# Patient Record
Sex: Male | Born: 1980 | Race: White | Hispanic: No | Marital: Single | Smoking: Current every day smoker
Health system: Southern US, Community
[De-identification: ages and names within clinical notes are randomized; demographics above are authoritative.]

---

## 2013-11-14 ENCOUNTER — Emergency Department (HOSPITAL_BASED_OUTPATIENT_CLINIC_OR_DEPARTMENT_OTHER)
Admission: EM | Admit: 2013-11-14 | Discharge: 2013-11-14 | Disposition: A | Discharge status: Home / Self Care | Payer: Self-pay | Attending: Emergency Medicine | Admitting: Emergency Medicine

## 2013-11-14 ENCOUNTER — Encounter (HOSPITAL_BASED_OUTPATIENT_CLINIC_OR_DEPARTMENT_OTHER): Payer: Self-pay | Admitting: Emergency Medicine

## 2013-11-14 DIAGNOSIS — H04301 Unspecified dacryocystitis of right lacrimal passage: Secondary | ICD-10-CM

## 2013-11-14 DIAGNOSIS — H579 Unspecified disorder of eye and adnexa: Secondary | ICD-10-CM | POA: Insufficient documentation

## 2013-11-14 DIAGNOSIS — F17200 Nicotine dependence, unspecified, uncomplicated: Secondary | ICD-10-CM | POA: Insufficient documentation

## 2013-11-14 DIAGNOSIS — H04309 Unspecified dacryocystitis of unspecified lacrimal passage: Secondary | ICD-10-CM | POA: Insufficient documentation

## 2013-11-14 MED ORDER — AMOXICILLIN-POT CLAVULANATE 500-125 MG PO TABS: 1.0000 | ORAL_TABLET | Freq: Three times a day (TID) | ORAL | Status: AC

## 2013-11-14 MED ORDER — IBUPROFEN 800 MG PO TABS
800.0000 mg | ORAL_TABLET | Freq: Once | ORAL | Status: AC
  Administered 2013-11-14: 800 mg via ORAL
  Filled 2013-11-14: qty 1

## 2013-11-14 NOTE — ED Provider Notes (Signed)
CSN: [id number]     Arrival date & time 11/14/13  1051 History   First MD Initiated Contact with Patient 11/14/13 1109     Chief Complaint  Patient presents with  . Eye Problem   HPI Seth Hudson is a 33 yo man with no PMH who is here over concern for his swollen right eye. He had a stye on the upper lid of his right eye last week, treated with OTC ointment. It subsided, then recurred yesterday. He used the OTC ointment again, then awoke with "goop" in his right eye and pain, worst in his upper right lid medially. His vision is mostly obscured. His pain is 6/10 in intensity and feels like a knife cutting into his upper lid. He has never had eye problems or styes prior to last week.  History reviewed. No pertinent past medical history. No past surgical history on file. No family history on file. History  Substance Use Topics  . Smoking status: Current Every Day Smoker -- 0.50 packs/day    Types: Cigarettes  . Smokeless tobacco: Not on file  . Alcohol Use: No    Review of Systems General: no recent illness Skin: birthmark (no hair growth) on head HEENT: no headaches Cardiac: no chest pain Respiratory: no SOB GI: no changes Urinary: no changes Endocrine: no changes Psychiatric: no changes  Allergies  Phenobarbital  Home Medications   Prior to Admission medications   Not on File   BP 124/92  Pulse 100  Temp(Src) 98.4 F (36.9 C) (Oral)  Resp 16  Ht 6\' 2"  (1.88 m)  Wt 415 lb (188.243 kg)  BMI 53.26 kg/m2  SpO2 95% Physical Exam Appearance: sitting comfortably in bed with right eye grossly edematous; upper and lower lids touching due to the swelling HENT: AT/[place] Ophthalmologic: EOMi, PERRL, exudate at inner palpebral fissure OD, tender to palpation medially, nontender on temporal side, noneyrthematous  Heart: RRR, normal S1S2 Lungs: CTAB, no wheezes Abdomen: BS+, no tenderness Musculoskeletal: no tenderness Extremities: no edema Neurologic: A&Ox3, facial sensation  intact Skin: patch of alopecia right parietal area    ED Course  Procedures (including critical care time) Labs Review Labs Reviewed - No data to display  Imaging Review No results found.   EKG Interpretation None      MDM   Final diagnoses:  None    Patient here with dacrocystitis in the right eye. He will be treated with warm compresses and sent out on a 10 day course of augmentin 500mg  TID. He can continue to manage symptoms with warm compress. [facility], but prefers to seek an ophthalmologist in [place], closer to home.   Dionne AnoJulia Jamirah Zelaya, MD 11/14/13 1145

## 2013-11-14 NOTE — ED Provider Notes (Signed)
I saw and evaluated the patient, reviewed the resident's note and I agree with the findings and plan.   .Face to face Exam:  General:  Awake HEENT:  Atraumatic  Tenderness and swelling R upper lid as noted Resp:  Normal effort Abd:  Nondistended Neuro:No focal weakness   Nelia Shiobert L Kelseigh Diver, MD 11/14/13 1206

## 2013-11-14 NOTE — ED Notes (Signed)
Pt. Presents with swollen right eyelid. Pt. States that he noticed a bump on his eye yesterday that he thought was a stye. Pt. Purchased some stye ointment OTC from [facility] and used it once yesterday morning and once yesterday PM before bed. Pt. Reports that he woke up this AM with increased swelling and tenderness to the right eye.

## 2013-11-14 NOTE — Discharge Instructions (Signed)
Please take the augmentin (antibiotics) three times a day for the next 10 days. Follow up with an ophthalmologist in the next week. You may use warm compresses to help with the symptoms as well.  Dacryocystitis  Dacryocystitis is an infection of the tear sac (lacrimal sac). The lacrimal sac lies between the inner corner of the eyelids and the nose. The glands of the eyelids produce tears. This is to keep the surface of the eye wet and protect it. These tears drain from the surface of the eyes through a duct in each lid (lacrimal ducts), then through the lacrimal sac into the nose. The tears are then swallowed. If the lacrimal sacs become blocked, bacteria begin to buildup. The lacrimal sacs can become infected. Dacryocystitis may be sudden (acute) or long-lasting (chronic). This problem is most common in infants because the tear ducts are not fully developed and clog easily. In that case, infants may have episodes of tearing and infection. However, in most cases, the problem gets better as the infant grows. CAUSES  The cause is often unknown. Known causes can include:  Malformation of the lacrimal sac.  Injury to the eye.  Eye infection.  Injury or inflammation of the nasal passages. SYMPTOMS   Usually only one eye is involved.  Excessive tearing and watering from the involved eye.  Tenderness, redness, and swelling of the lower lid near the nose.  A sore, red, inflamed bump on the inner corner of the lower lid. DIAGNOSIS  A diagnosis is made after an eye exam to see how much blockage is present and if the surface of the eye is also infected. A culture of the fluid from the lacrimal sac may be examined to find if a specific infection is present. TREATMENT  Treatment depends on:   The person's age.  Whether or not the infection is chronic or acute.  The amount of blockage that is present. Additional treatment Sometimes massaging the area (starting from the inside of the eye and  gently massaging down toward the nose) will improve the condition, combined with antibiotic eyedrops or ointments. If massaging the area does not work, it may be necessary to probe the ducts and open up the drainage system. While this is easily done in the office in adults, probing usually has to be done under general anesthesia in infants.  If the blockage cannot be cleared by probing, surgery may be needed under general anesthesia to create a direct opening for tears to flow between the lacrimal sac and the inside of the nose (dacryocystorhinostomy, DCR). HOME CARE INSTRUCTIONS   Use any antibiotic eyedrops, ointment, or pills as directed by the caregiver. Finish all medicines even if the symptoms start to get better.  Massage the lacrimal sac as directed by the caregiver. SEEK IMMEDIATE MEDICAL CARE IF:   There is increased pain, swelling, redness, or drainage from the eye.  Muscle aches, chills, or a general sick feeling develop.  A fever or persistent symptoms develop for more than 2-3 days.  The fever and symptoms suddenly get worse. MAKE SURE YOU:   Understand these instructions.  Will watch your condition.  Will get help right away if you are not doing well or get worse. Document Released: 03/12/2000 Document Revised: 07/30/2013 Document Reviewed: 08/16/2011 [facility] Patient Information 2015 [place] [place]. This information is not intended to replace advice given to you by your health care provider. Make sure you discuss any questions you have with your health care provider.

## 2013-11-15 ENCOUNTER — Emergency Department (HOSPITAL_BASED_OUTPATIENT_CLINIC_OR_DEPARTMENT_OTHER): Payer: Self-pay

## 2013-11-15 ENCOUNTER — Emergency Department (HOSPITAL_BASED_OUTPATIENT_CLINIC_OR_DEPARTMENT_OTHER)
Admission: EM | Admit: 2013-11-15 | Discharge: 2013-11-15 | Disposition: A | Discharge status: Home / Self Care | Payer: Self-pay | Attending: Emergency Medicine | Admitting: Emergency Medicine

## 2013-11-15 ENCOUNTER — Encounter (HOSPITAL_BASED_OUTPATIENT_CLINIC_OR_DEPARTMENT_OTHER): Payer: Self-pay | Admitting: Emergency Medicine

## 2013-11-15 DIAGNOSIS — F17200 Nicotine dependence, unspecified, uncomplicated: Secondary | ICD-10-CM | POA: Insufficient documentation

## 2013-11-15 DIAGNOSIS — Z792 Long term (current) use of antibiotics: Secondary | ICD-10-CM | POA: Insufficient documentation

## 2013-11-15 DIAGNOSIS — H5710 Ocular pain, unspecified eye: Secondary | ICD-10-CM | POA: Insufficient documentation

## 2013-11-15 DIAGNOSIS — H019 Unspecified inflammation of eyelid: Secondary | ICD-10-CM | POA: Insufficient documentation

## 2013-11-15 LAB — CBC WITH DIFFERENTIAL/PLATELET
BASOS ABS: 0 10*3/uL (ref 0.0–0.1)
Basophils Relative: 0 % (ref 0–1)
Eosinophils Absolute: 0.3 10*3/uL (ref 0.0–0.7)
Eosinophils Relative: 2 % (ref 0–5)
HCT: 44.6 % (ref 39.0–52.0)
HEMOGLOBIN: 14.5 g/dL (ref 13.0–17.0)
Lymphocytes Relative: 20 % (ref 12–46)
Lymphs Abs: 2.3 10*3/uL (ref 0.7–4.0)
MCH: 29.1 pg (ref 26.0–34.0)
MCHC: 32.5 g/dL (ref 30.0–36.0)
MCV: 89.4 fL (ref 78.0–100.0)
MONOS PCT: 8 % (ref 3–12)
Monocytes Absolute: 0.9 10*3/uL (ref 0.1–1.0)
Neutro Abs: 8.1 10*3/uL — ABNORMAL HIGH (ref 1.7–7.7)
Neutrophils Relative %: 70 % (ref 43–77)
Platelets: 273 10*3/uL (ref 150–400)
RBC: 4.99 MIL/uL (ref 4.22–5.81)
RDW: 14.9 % (ref 11.5–15.5)
WBC: 11.6 10*3/uL — ABNORMAL HIGH (ref 4.0–10.5)

## 2013-11-15 LAB — BASIC METABOLIC PANEL
ANION GAP: 9 (ref 5–15)
BUN: 11 mg/dL (ref 6–23)
CHLORIDE: 105 meq/L (ref 96–112)
CO2: 28 mEq/L (ref 19–32)
Calcium: 9.1 mg/dL (ref 8.4–10.5)
Creatinine, Ser: 0.9 mg/dL (ref 0.50–1.35)
GFR calc Af Amer: >90 mL/min (ref >90)
GFR calc non Af Amer: >90 mL/min (ref >90)
Glucose, Bld: 98 mg/dL (ref 70–99)
Potassium: 4.3 mEq/L (ref 3.7–5.3)
Sodium: 142 mEq/L (ref 137–147)

## 2013-11-15 LAB — I-STAT CG4 LACTIC ACID, ED: LACTIC ACID, VENOUS: 0.97 mmol/L (ref 0.5–2.2)

## 2013-11-15 MED ORDER — OXYCODONE-ACETAMINOPHEN 5-325 MG PO TABS: 1.0000 | ORAL_TABLET | ORAL | Status: AC | PRN

## 2013-11-15 MED ORDER — SODIUM CHLORIDE 0.9 % IV BOLUS (SEPSIS)
500.0000 mL | Freq: Once | INTRAVENOUS | Status: AC
  Administered 2013-11-15: 500 mL via INTRAVENOUS

## 2013-11-15 MED ORDER — DEXTROSE 5 % IV SOLN
1.0000 g | Freq: Once | INTRAVENOUS | Status: AC
  Administered 2013-11-15: 1 g via INTRAVENOUS

## 2013-11-15 MED ORDER — IOHEXOL 300 MG/ML  SOLN
75.0000 mL | Freq: Once | INTRAMUSCULAR | Status: AC | PRN
  Administered 2013-11-15: 75 mL via INTRAVENOUS

## 2013-11-15 MED ORDER — CEFTRIAXONE SODIUM 1 G IJ SOLR
INTRAMUSCULAR | Status: AC
  Filled 2013-11-15: qty 10

## 2013-11-15 MED ORDER — VANCOMYCIN HCL IN DEXTROSE 1-5 GM/200ML-% IV SOLN
1000.0000 mg | Freq: Once | INTRAVENOUS | Status: AC
  Administered 2013-11-15: 1000 mg via INTRAVENOUS
  Filled 2013-11-15: qty 200

## 2013-11-15 NOTE — ED Notes (Signed)
States he was here yesterday for same. Dx with infected tear sac. Started antibiotic yesterday and was unable to sleep during the night d/t pain. States has clear drainage. Rt eye very swollen, only able to open slightly to see.

## 2013-11-15 NOTE — ED Notes (Signed)
Pt discharge pending d/t antibiotics being administered.

## 2013-11-15 NOTE — Discharge Instructions (Signed)
Take all medicine as prescribed.  Return if worsened swelling, fever, or pain.

## 2013-11-15 NOTE — ED Provider Notes (Signed)
CSN: [id number]     Arrival date & time 11/15/13  1058 History   First MD Initiated Contact with Patient 11/15/13 1129     Chief Complaint  Patient presents with  . Eye Pain     (Consider location/radiation/quality/duration/timing/severity/associated sxs/prior Treatment) HPI 33 year old previously healthy male who has been having some difficulty with eyelid pain and swelling over the past week. He was seen here yesterday and diagnosed with dacryocystitis. He has been taking Augmentin since that time. He states that the pain has worsened and his eyelid has swollen more. He denies any visual problems. He states that he feels like he had some chills earlier today and may have had a fever. He has been taking by mouth without difficulty. He has not noted rashes anywhere else. He has not been weak or lightheaded. History reviewed. No pertinent past medical history. History reviewed. No pertinent past surgical history. No family history on file. History  Substance Use Topics  . Smoking status: Current Every Day Smoker -- 1.00 packs/day    Types: Cigarettes  . Smokeless tobacco: Not on file  . Alcohol Use: No    Review of Systems  All other systems reviewed and are negative.     Allergies  Phenobarbital  Home Medications   Prior to Admission medications   Medication Sig Start Date End Date Taking? Authorizing Provider  amoxicillin-clavulanate (AUGMENTIN) 500-125 MG per tablet Take 1 tablet (500 mg total) by mouth 3 (three) times daily. 11/14/13  Yes Dionne AnoJulia Mallory, MD   BP 158/95  Pulse 104  Temp(Src) 99.6 F (37.6 C) (Oral)  Resp 22  Ht 6\' 2"  (1.88 m)  Wt 415 lb (188.243 kg)  BMI 53.26 kg/m2  SpO2 97% Physical Exam  Nursing note and vitals reviewed. Constitutional: He is oriented to person, place, and time. He appears well-developed and well-nourished.  Morbidly obese  HENT:  Head: Normocephalic and atraumatic.  Right Ear: External ear normal.  Left Ear: External ear  normal.  Mouth/Throat: Oropharynx is clear and moist.  Eyes: Conjunctivae and EOM are normal. Pupils are equal, round, and reactive to light. Right eye exhibits no discharge and no exudate.    Lid is injected with some swelling but no evidence of fluctuance.  Neck: Normal range of motion. Neck supple.  Cardiovascular: Normal rate, regular rhythm, normal heart sounds and intact distal pulses.   Pulmonary/Chest: Effort normal and breath sounds normal.  Abdominal: Soft. Bowel sounds are normal.  Musculoskeletal: Normal range of motion.  Neurological: He is alert and oriented to person, place, and time. He has normal reflexes.  Skin: Skin is warm and dry.  Psychiatric: He has a normal mood and affect. His behavior is normal. Judgment and thought content normal.    ED Course  Procedures (including critical care time) Labs Review Labs Reviewed  CBC WITH DIFFERENTIAL - Abnormal; Notable for the following:    WBC 11.6 (*)    Neutro Abs 8.1 (*)    All other components within normal limits  CULTURE, BLOOD (ROUTINE X 2)  CULTURE, BLOOD (ROUTINE X 2)  BASIC METABOLIC PANEL  LACTIC ACID, PLASMA  I-STAT CG4 LACTIC ACID, ED    Imaging Review Ct Orbits W/cm  11/15/2013   CLINICAL DATA:  Pain, swelling and redness about the right eye for 2 days.  EXAM: CT ORBITS WITH CONTRAST  TECHNIQUE: Multidetector CT imaging of the orbits was performed following the bolus administration of intravenous contrast.  CONTRAST:  75 mL OMNIPAQUE IOHEXOL 300 MG/ML  SOLN  COMPARISON:  None.  FINDINGS: Preseptal soft tissue swelling is seen about the right eye. There is no postseptal swelling. Orbital fat, extraocular muscles and the optic nerves appear normal. The globes are intact and the lenses are located. No abscess is identified. There is no evidence of osteomyelitis. Imaged paranasal sinuses and mastoid air cells are clear. Imaged intracranial contents appear normal.  IMPRESSION: Preseptal soft tissue swelling about  the right eye. No abscess is identified and no other abnormality is seen.   Electronically Signed   By: Drusilla Kanner M.D.   On: 11/15/2013 12:46     EKG Interpretation None      MDM   Final diagnoses:  Infection of eyelid    33 year old male with eyelid inflammation being treated for cellulitis.  No evidence of eye involvement or abscess on ct.  Patient advised to continue augmentin and add warm soaks.  He is advised regarding return precautions and need for close follow up.      Hilario Quarry, MD 11/15/13 [phone]

## 2013-11-21 LAB — CULTURE, BLOOD (ROUTINE X 2)
Culture: NO GROWTH
Culture: NO GROWTH

## 2015-04-29 IMAGING — CT CT ORBITS W/ CM
3 series · 14 of 47 positions shown, 16 images · IV contrast (omnipaque)
Comparison: None.

CLINICAL DATA: Pain, swelling and redness about the right eye for 2
days.

EXAM:
CT ORBITS WITH CONTRAST
TECHNIQUE: Multidetector CT imaging of the orbits was performed following the
bolus administration of intravenous contrast.
CONTRAST:  75 mL OMNIPAQUE IOHEXOL 300 MG/ML  SOLN

[Series 3: orbits 2.0 h30s st · axial · 0.30mm/px · z∈[+1122,+1196]mm · 8 of 43 slices shown, 10 images]
[im 3/43  brain]
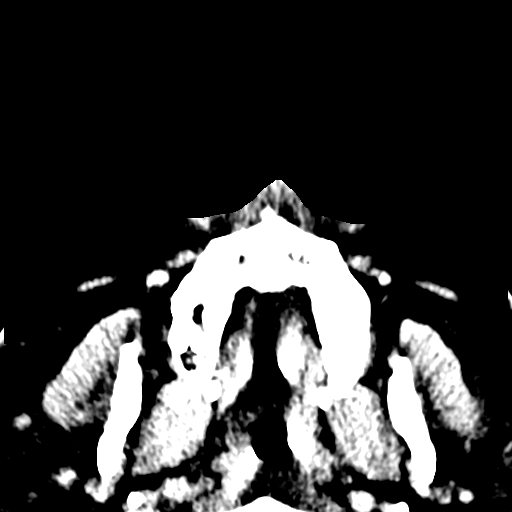
[im 3/43  bone]
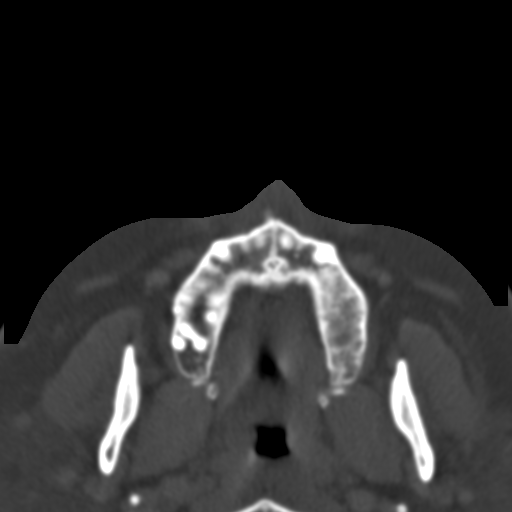
[im 9/43  bone]
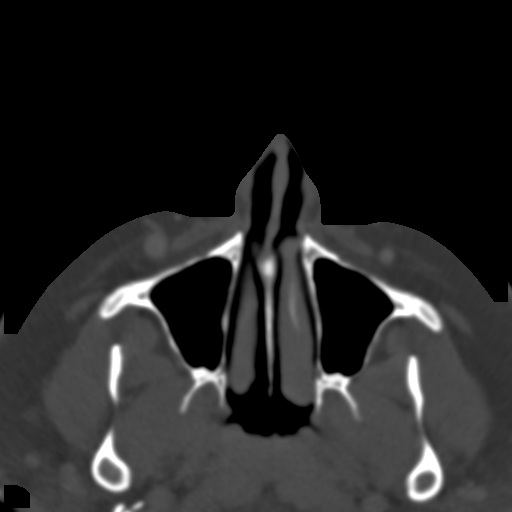
[im 14/43  bone]
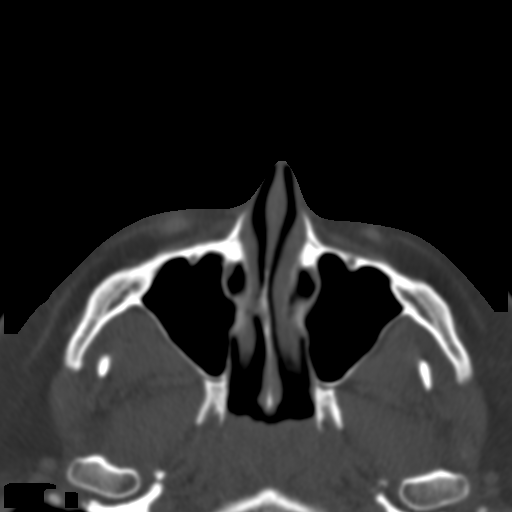
[im 19/43  bone]
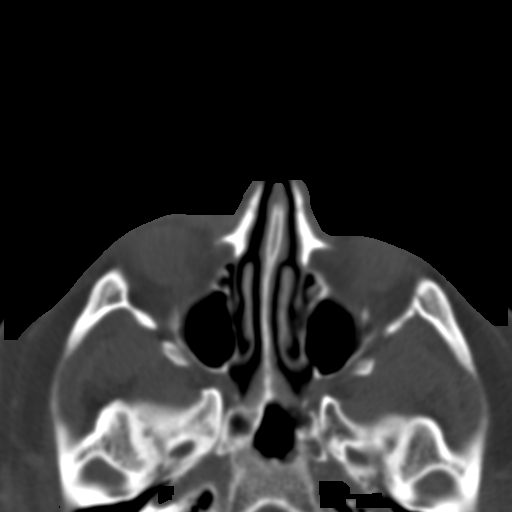
[im 24/43  brain]
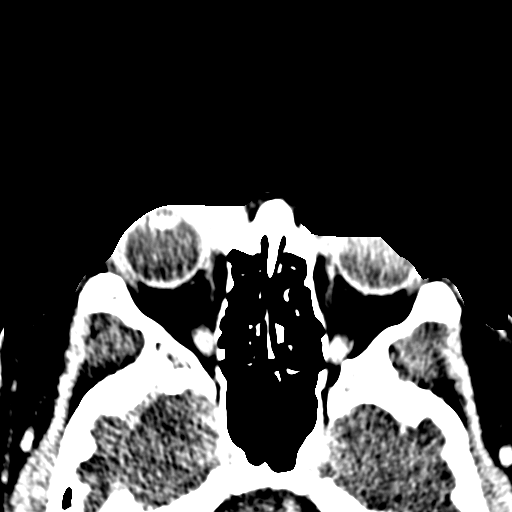
[im 24/43  bone]
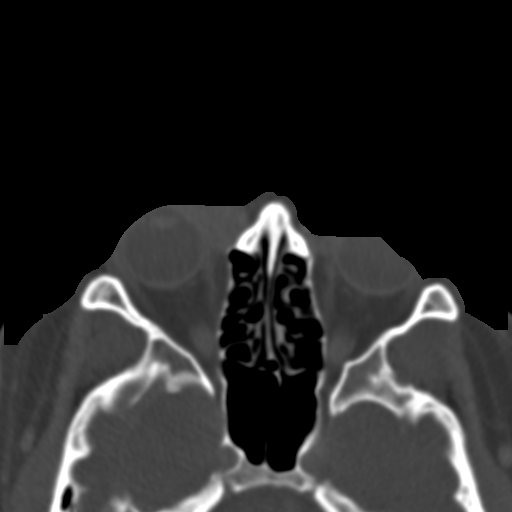
[im 29/43  bone]
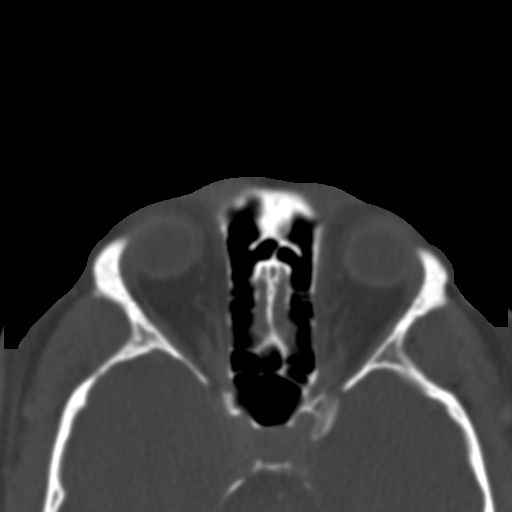
[im 34/43  bone]
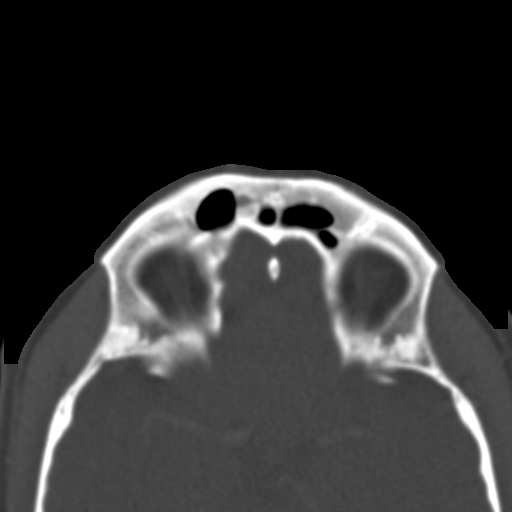
[im 40/43  bone]
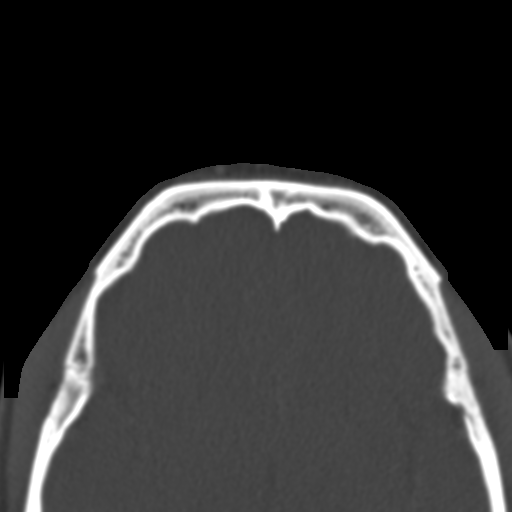

[Series 9: orbits 2.0 sagittal · sagittal · 0.20mm/px · 3 of 67 slices shown]
[im 23/67  bone]
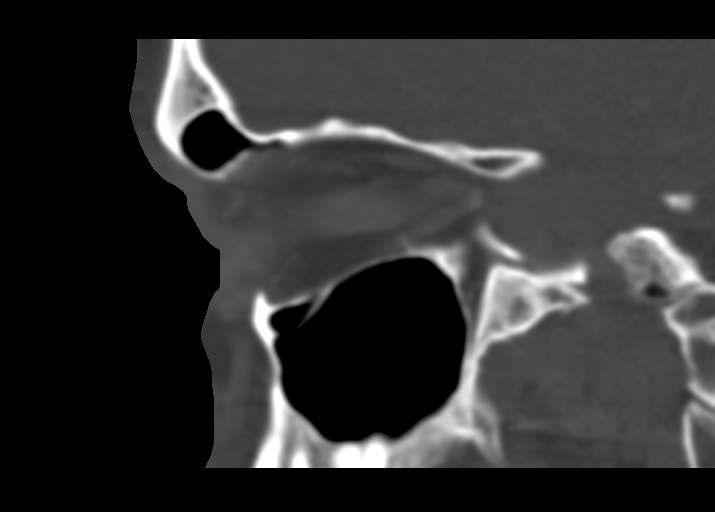
[im 34/67  bone]
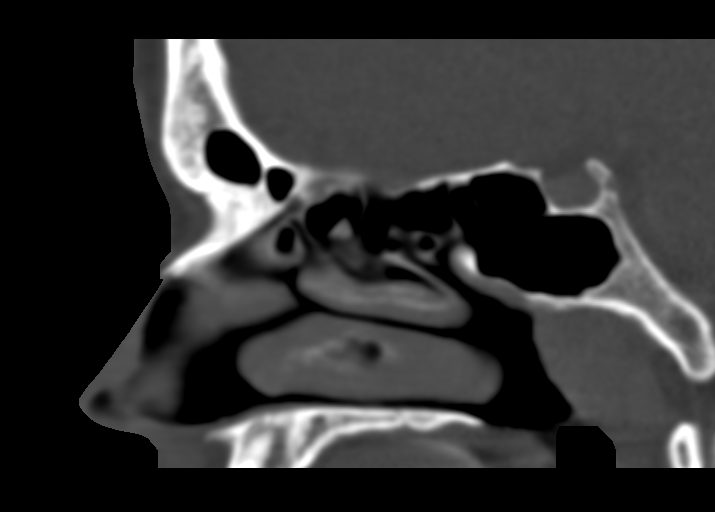
[im 45/67  bone]
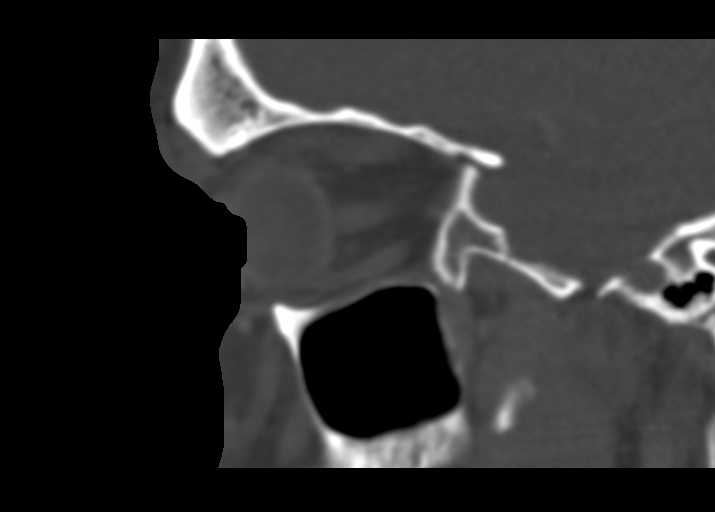

[Series 10: orbits 2.0 coronal · coronal · 0.19mm/px · 3 of 54 slices shown]
[im 18/54  bone]
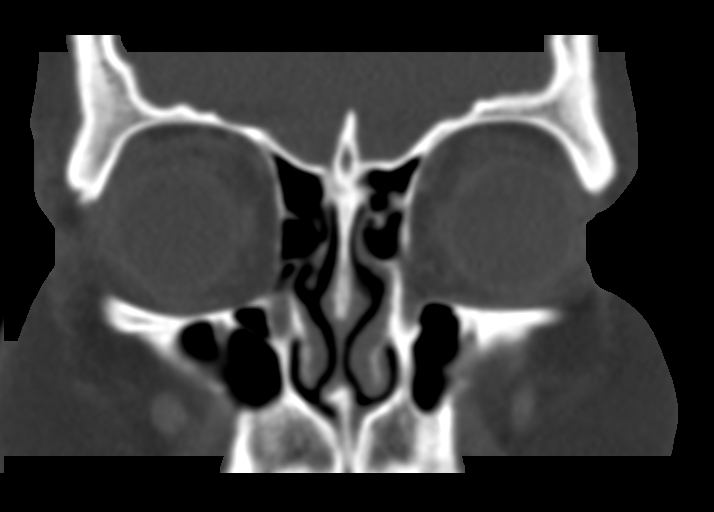
[im 24/54  bone]
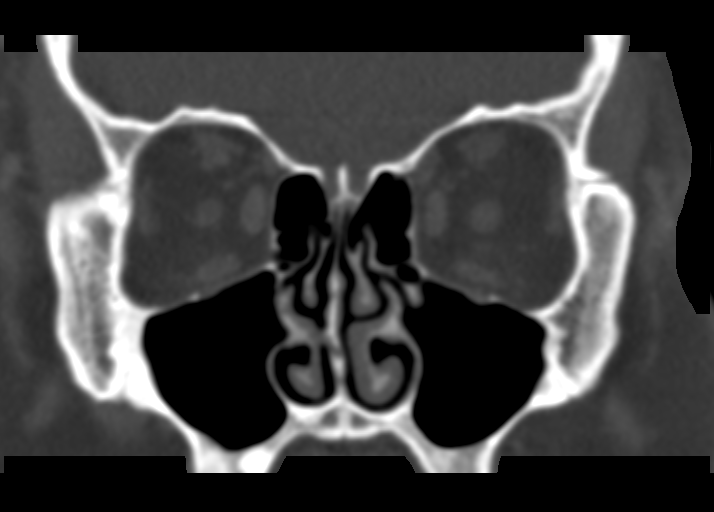
[im 30/54  bone]
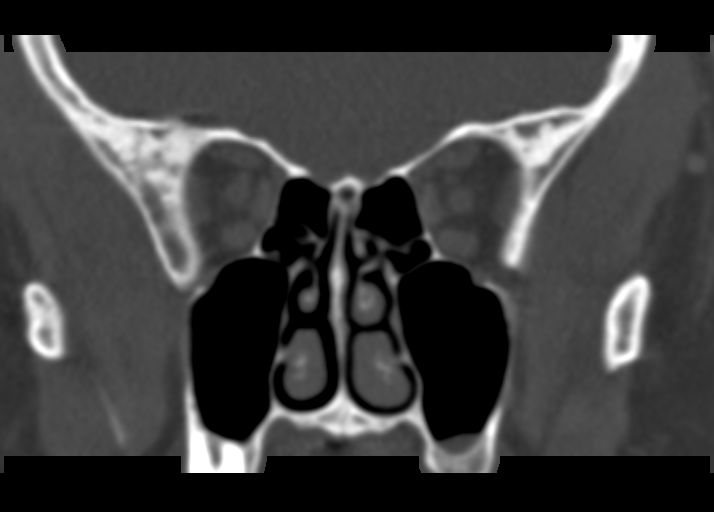

[14 of 47 positions shown; findings below may reference images not displayed]

FINDINGS: Preseptal soft tissue swelling is seen about the right eye. There is
no postseptal swelling. Orbital fat, extraocular muscles and the
optic nerves appear normal. The globes are intact and the lenses are
located. No abscess is identified. There is no evidence of
osteomyelitis. Imaged paranasal sinuses and mastoid air cells are
clear. Imaged intracranial contents appear normal.
IMPRESSION: Preseptal soft tissue swelling about the right eye. No abscess is
identified and no other abnormality is seen.
